# Patient Record
Sex: Male | Born: 1950 | Race: Black or African American | Hispanic: No | Marital: Married | Smoking: Former smoker
Health system: Southern US, Community
[De-identification: ages and names within clinical notes are randomized; demographics above are authoritative.]

## PROBLEM LIST (undated history)

## (undated) DIAGNOSIS — G8929 Other chronic pain: Secondary | ICD-10-CM

## (undated) DIAGNOSIS — J189 Pneumonia, unspecified organism: Secondary | ICD-10-CM

## (undated) DIAGNOSIS — G4730 Sleep apnea, unspecified: Secondary | ICD-10-CM

## (undated) DIAGNOSIS — R531 Weakness: Secondary | ICD-10-CM

## (undated) DIAGNOSIS — J45909 Unspecified asthma, uncomplicated: Secondary | ICD-10-CM

## (undated) DIAGNOSIS — Z860100 Personal history of colon polyps, unspecified: Secondary | ICD-10-CM

## (undated) DIAGNOSIS — F419 Anxiety disorder, unspecified: Secondary | ICD-10-CM

## (undated) DIAGNOSIS — M542 Cervicalgia: Secondary | ICD-10-CM

## (undated) DIAGNOSIS — K219 Gastro-esophageal reflux disease without esophagitis: Secondary | ICD-10-CM

## (undated) DIAGNOSIS — J439 Emphysema, unspecified: Secondary | ICD-10-CM

## (undated) DIAGNOSIS — F515 Nightmare disorder: Secondary | ICD-10-CM

## (undated) DIAGNOSIS — I10 Essential (primary) hypertension: Secondary | ICD-10-CM

## (undated) HISTORY — PX: COLONOSCOPY: SHX174

## (undated) HISTORY — PX: HERNIA REPAIR: SHX51

---

## 2013-09-18 ENCOUNTER — Other Ambulatory Visit: Payer: Self-pay | Admitting: Family Medicine

## 2013-09-18 DIAGNOSIS — Z87891 Personal history of nicotine dependence: Secondary | ICD-10-CM

## 2013-09-18 DIAGNOSIS — M79606 Pain in leg, unspecified: Secondary | ICD-10-CM

## 2013-10-06 ENCOUNTER — Other Ambulatory Visit: Payer: Self-pay

## 2014-04-13 DIAGNOSIS — J189 Pneumonia, unspecified organism: Secondary | ICD-10-CM

## 2014-04-13 HISTORY — DX: Pneumonia, unspecified organism: J18.9

## 2015-04-19 ENCOUNTER — Ambulatory Visit: Payer: BC Managed Care – PPO | Admitting: Podiatry

## 2015-04-22 ENCOUNTER — Ambulatory Visit: Payer: BC Managed Care – PPO | Admitting: Podiatry

## 2015-12-26 ENCOUNTER — Ambulatory Visit: Payer: Self-pay | Admitting: Physician Assistant

## 2016-01-28 NOTE — Pre-Procedure Instructions (Signed)
Johnny Spence  01/28/2016      [facility] [place], [place] - [address] [place] [address] Phone: [phone] Fax: [phone]    Your procedure is scheduled on Thurs, Oct 26 @ 7:30 AM  [facility] to [facility] Admitting at 5:30 AM  Call this number if you have problems the morning of surgery:  [phone]   Remember:  Do not eat food or drink liquids after midnight.  Take these medicines the morning of surgery with A SIP OF WATER Albuterol<Bring Your Inhaler With You>,Symbicort(bring inhaler),and Omeprazole(Prilosec)             Stop taking any Vitamins or Herbal Medications. No Goody's,BC's,Aleve,Advil,Motrin,Ibuprofen,or Fish Oil.    Do not wear jewelry.  Do not wear lotions, powders,colognes, or deoderant.             Men may shave face and neck.  Do not bring valuables to the hospital.  [facility] is not responsible for any belongings or valuables.  Contacts, dentures or bridgework may not be worn into surgery.  Leave your suitcase in the car.  After surgery it may be brought to your room.  For patients admitted to the hospital, discharge time will be determined by your treatment team.  Patients discharged the day of surgery will not be allowed to drive home.    [facility]  Before surgery, you can play an important role.  Because skin is not sterile, your skin needs to be as free of germs as possible.  You can reduce the number of germs on you skin by washing with CHG (chlorahexidine gluconate) soap before surgery.  CHG is an antiseptic cleaner which kills germs and bonds with the skin to continue killing germs even after washing.  Please DO NOT use if you have an allergy to CHG or antibacterial soaps.  If your skin becomes reddened/irritated stop using the CHG and inform your nurse when you arrive at Short Stay.  Do not shave (including legs and underarms) for at  least 48 hours prior to the first CHG shower.  You may shave your face.  Please follow these instructions carefully:   1.  Shower with CHG Soap the night before surgery and the                                morning of Surgery.  2.  If you choose to wash your hair, wash your hair first as usual with your       normal shampoo.  3.  After you shampoo, rinse your hair and body thoroughly to remove the                      Shampoo.  4.  Use CHG as you would any other liquid soap.  You can apply chg directly       to the skin and wash gently with scrungie or a clean washcloth.  5.  Apply the CHG Soap to your body ONLY FROM THE NECK DOWN.        Do not use on open wounds or open sores.  Avoid contact with your eyes,       ears, mouth and genitals (private parts).  Wash genitals (private parts)       with your normal soap.  6.  Wash thoroughly, paying special  attention to the area where your surgery        will be performed.  7.  Thoroughly rinse your body with warm water from the neck down.  8.  DO NOT shower/wash with your normal soap after using and rinsing off       the CHG Soap.  9.  Pat yourself dry with a clean towel.            10.  Wear clean pajamas.            11.  Place clean sheets on your bed the night of your first shower and do not        sleep with pets.  Day of Surgery  Do not apply any lotions/deoderants the morning of surgery.  Please wear clean clothes to the hospital/surgery center.    Please read over the following fact sheets that you were given. Pain Booklet, Coughing and Deep Breathing, MRSA Information and Surgical Site Infection Prevention

## 2016-01-28 NOTE — Progress Notes (Signed)
Verified with PA Porfirio MylarCarmen that knee high teds are what is needed.Orders in epic didn't clarify

## 2016-01-29 ENCOUNTER — Encounter (HOSPITAL_COMMUNITY)
Admission: RE | Admit: 2016-01-29 | Discharge: 2016-01-29 | Disposition: A | Discharge status: Home / Self Care | Payer: Non-veteran care | Source: Ambulatory Visit | Attending: Orthopedic Surgery | Admitting: Orthopedic Surgery

## 2016-01-29 ENCOUNTER — Encounter (HOSPITAL_COMMUNITY): Payer: Self-pay

## 2016-01-29 DIAGNOSIS — M5030 Other cervical disc degeneration, unspecified cervical region: Secondary | ICD-10-CM | POA: Insufficient documentation

## 2016-01-29 DIAGNOSIS — Z01818 Encounter for other preprocedural examination: Secondary | ICD-10-CM | POA: Insufficient documentation

## 2016-01-29 HISTORY — DX: Unspecified asthma, uncomplicated: J45.909

## 2016-01-29 HISTORY — DX: Personal history of colon polyps, unspecified: Z86.0100

## 2016-01-29 HISTORY — DX: Weakness: R53.1

## 2016-01-29 HISTORY — DX: Other chronic pain: G89.29

## 2016-01-29 HISTORY — DX: Emphysema, unspecified: J43.9

## 2016-01-29 HISTORY — DX: Gastro-esophageal reflux disease without esophagitis: K21.9

## 2016-01-29 HISTORY — DX: Cervicalgia: M54.2

## 2016-01-29 HISTORY — DX: Essential (primary) hypertension: I10

## 2016-01-29 HISTORY — DX: Nightmare disorder: F51.5

## 2016-01-29 HISTORY — DX: Pneumonia, unspecified organism: J18.9

## 2016-01-29 HISTORY — DX: Sleep apnea, unspecified: G47.30

## 2016-01-29 HISTORY — DX: Anxiety disorder, unspecified: F41.9

## 2016-01-29 HISTORY — DX: Personal history of colonic polyps: Z86.010

## 2016-01-29 LAB — CBC
HCT: 41 % (ref 39.0–52.0)
Hemoglobin: 14.4 g/dL (ref 13.0–17.0)
MCH: 30.6 pg (ref 26.0–34.0)
MCHC: 35.1 g/dL (ref 30.0–36.0)
MCV: 87 fL (ref 78.0–100.0)
PLATELETS: 227 10*3/uL (ref 150–400)
RBC: 4.71 MIL/uL (ref 4.22–5.81)
RDW: 12.7 % (ref 11.5–15.5)
WBC: 5.1 10*3/uL (ref 4.0–10.5)

## 2016-01-29 LAB — BASIC METABOLIC PANEL
Anion gap: 9 (ref 5–15)
BUN: 9 mg/dL (ref 6–20)
CALCIUM: 9.4 mg/dL (ref 8.9–10.3)
CO2: 25 mmol/L (ref 22–32)
CREATININE: 1.2 mg/dL (ref 0.61–1.24)
Chloride: 105 mmol/L (ref 101–111)
GFR calc Af Amer: >60 mL/min (ref >60)
GLUCOSE: 106 mg/dL — AB (ref 65–99)
Potassium: 4 mmol/L (ref 3.5–5.1)
SODIUM: 139 mmol/L (ref 135–145)

## 2016-01-29 LAB — SURGICAL PCR SCREEN
MRSA, PCR: NEGATIVE
STAPHYLOCOCCUS AUREUS: NEGATIVE

## 2016-01-29 NOTE — Progress Notes (Addendum)
Cardiologist denies  Medical Md is Dr.Stephanie Borum   Echo denies  Stress test > 6 yrs ago  Heart cath denies  EKG to be requested from [facility] in [place]  CXR to be requested from [facility] in [place]

## 2016-02-06 ENCOUNTER — Ambulatory Visit (HOSPITAL_COMMUNITY): Payer: Non-veteran care | Admitting: Anesthesiology

## 2016-02-06 ENCOUNTER — Encounter (HOSPITAL_COMMUNITY)
Admission: RE | Disposition: A | Discharge status: Home / Self Care | Payer: Self-pay | Source: Ambulatory Visit | Attending: Orthopedic Surgery

## 2016-02-06 ENCOUNTER — Observation Stay (HOSPITAL_COMMUNITY): Payer: Non-veteran care

## 2016-02-06 ENCOUNTER — Encounter (HOSPITAL_COMMUNITY): Payer: Self-pay | Admitting: Certified Registered Nurse Anesthetist

## 2016-02-06 ENCOUNTER — Ambulatory Visit (HOSPITAL_COMMUNITY): Payer: Non-veteran care | Admitting: Emergency Medicine

## 2016-02-06 ENCOUNTER — Observation Stay (HOSPITAL_COMMUNITY)
Admission: RE | Admit: 2016-02-06 | Discharge: 2016-02-07 | Disposition: A | Discharge status: Home / Self Care | Payer: Non-veteran care | Source: Ambulatory Visit | Attending: Orthopedic Surgery | Admitting: Orthopedic Surgery

## 2016-02-06 DIAGNOSIS — M4712 Other spondylosis with myelopathy, cervical region: Secondary | ICD-10-CM | POA: Diagnosis present

## 2016-02-06 DIAGNOSIS — Z79899 Other long term (current) drug therapy: Secondary | ICD-10-CM | POA: Diagnosis not present

## 2016-02-06 DIAGNOSIS — K219 Gastro-esophageal reflux disease without esophagitis: Secondary | ICD-10-CM | POA: Diagnosis not present

## 2016-02-06 DIAGNOSIS — I10 Essential (primary) hypertension: Secondary | ICD-10-CM | POA: Insufficient documentation

## 2016-02-06 DIAGNOSIS — Z87891 Personal history of nicotine dependence: Secondary | ICD-10-CM | POA: Insufficient documentation

## 2016-02-06 DIAGNOSIS — J439 Emphysema, unspecified: Secondary | ICD-10-CM | POA: Diagnosis not present

## 2016-02-06 DIAGNOSIS — M4722 Other spondylosis with radiculopathy, cervical region: Secondary | ICD-10-CM | POA: Insufficient documentation

## 2016-02-06 DIAGNOSIS — G959 Disease of spinal cord, unspecified: Secondary | ICD-10-CM | POA: Diagnosis present

## 2016-02-06 DIAGNOSIS — Z981 Arthrodesis status: Secondary | ICD-10-CM

## 2016-02-06 DIAGNOSIS — G4730 Sleep apnea, unspecified: Secondary | ICD-10-CM | POA: Insufficient documentation

## 2016-02-06 DIAGNOSIS — Z419 Encounter for procedure for purposes other than remedying health state, unspecified: Secondary | ICD-10-CM

## 2016-02-06 HISTORY — PX: ANTERIOR CERVICAL DECOMP/DISCECTOMY FUSION: SHX1161

## 2016-02-06 SURGERY — ANTERIOR CERVICAL DECOMPRESSION/DISCECTOMY FUSION 2 LEVEL/HARDWARE REMOVAL
Anesthesia: General | Site: Neck

## 2016-02-06 MED ORDER — PHENOL 1.4 % MT LIQD
1.0000 | OROMUCOSAL | Status: DC | PRN
  Administered 2016-02-06: 1 via OROMUCOSAL
  Filled 2016-02-06: qty 177

## 2016-02-06 MED ORDER — METOPROLOL TARTARATE 1 MG/ML SYRINGE (5ML)
Status: DC | PRN
  Administered 2016-02-06 (×2): 2.5 mg via INTRAVENOUS

## 2016-02-06 MED ORDER — ONDANSETRON HCL 4 MG PO TABS: 4.0000 mg | ORAL_TABLET | Freq: Three times a day (TID) | ORAL | 0 refills | Status: AC | PRN

## 2016-02-06 MED ORDER — MENTHOL 3 MG MT LOZG
1.0000 | LOZENGE | OROMUCOSAL | Status: DC | PRN
  Filled 2016-02-06: qty 9

## 2016-02-06 MED ORDER — ONDANSETRON HCL 4 MG/2ML IJ SOLN: 4.0000 mg | INTRAMUSCULAR | Status: DC | PRN

## 2016-02-06 MED ORDER — ARTIFICIAL TEARS OP OINT
TOPICAL_OINTMENT | OPHTHALMIC | Status: AC
  Filled 2016-02-06: qty 3.5

## 2016-02-06 MED ORDER — PROPOFOL 1000 MG/100ML IV EMUL
INTRAVENOUS | Status: AC
  Filled 2016-02-06: qty 100

## 2016-02-06 MED ORDER — ACETAMINOPHEN 10 MG/ML IV SOLN
INTRAVENOUS | Status: AC
  Filled 2016-02-06: qty 100

## 2016-02-06 MED ORDER — DEXTROSE 5 % IV SOLN
500.0000 mg | Freq: Four times a day (QID) | INTRAVENOUS | Status: DC | PRN
  Filled 2016-02-06: qty 5

## 2016-02-06 MED ORDER — PHENYLEPHRINE HCL 10 MG/ML IJ SOLN
INTRAVENOUS | Status: DC | PRN
  Administered 2016-02-06: 25 ug/min via INTRAVENOUS
  Administered 2016-02-06 (×2): via INTRAVENOUS

## 2016-02-06 MED ORDER — ONDANSETRON HCL 4 MG/2ML IJ SOLN: 4.0000 mg | Freq: Once | INTRAMUSCULAR | Status: DC | PRN

## 2016-02-06 MED ORDER — GLYCOPYRROLATE 0.2 MG/ML IJ SOLN
INTRAMUSCULAR | Status: DC | PRN
  Administered 2016-02-06 (×2): 0.1 mg via INTRAVENOUS

## 2016-02-06 MED ORDER — ALBUTEROL SULFATE HFA 108 (90 BASE) MCG/ACT IN AERS
INHALATION_SPRAY | RESPIRATORY_TRACT | Status: AC
  Filled 2016-02-06: qty 6.7

## 2016-02-06 MED ORDER — DEXAMETHASONE SODIUM PHOSPHATE 10 MG/ML IJ SOLN
INTRAMUSCULAR | Status: DC | PRN
  Administered 2016-02-06: 10 mg via INTRAVENOUS

## 2016-02-06 MED ORDER — KETAMINE HCL-SODIUM CHLORIDE 100-0.9 MG/10ML-% IV SOSY
PREFILLED_SYRINGE | INTRAVENOUS | Status: AC
  Filled 2016-02-06: qty 10

## 2016-02-06 MED ORDER — SODIUM CHLORIDE 0.9 % IV SOLN: 250.0000 mL | INTRAVENOUS | Status: DC

## 2016-02-06 MED ORDER — ARTIFICIAL TEARS OP OINT
TOPICAL_OINTMENT | OPHTHALMIC | Status: DC | PRN
  Administered 2016-02-06: 1 via OPHTHALMIC

## 2016-02-06 MED ORDER — SODIUM CHLORIDE 0.9% FLUSH
3.0000 mL | Freq: Two times a day (BID) | INTRAVENOUS | Status: DC
  Administered 2016-02-07: 3 mL via INTRAVENOUS

## 2016-02-06 MED ORDER — OXYCODONE HCL 5 MG PO TABS
10.0000 mg | ORAL_TABLET | ORAL | Status: DC | PRN
  Administered 2016-02-06 – 2016-02-07 (×4): 10 mg via ORAL
  Filled 2016-02-06 (×4): qty 2

## 2016-02-06 MED ORDER — LIDOCAINE-EPINEPHRINE (PF) 1 %-1:200000 IJ SOLN
INTRAMUSCULAR | Status: DC | PRN
  Administered 2016-02-06: 4 mL

## 2016-02-06 MED ORDER — MIDAZOLAM HCL 2 MG/2ML IJ SOLN
INTRAMUSCULAR | Status: AC
  Filled 2016-02-06: qty 2

## 2016-02-06 MED ORDER — 0.9 % SODIUM CHLORIDE (POUR BTL) OPTIME
TOPICAL | Status: DC | PRN
  Administered 2016-02-06: 1000 mL

## 2016-02-06 MED ORDER — KETAMINE HCL 100 MG/ML IJ SOLN
INTRAMUSCULAR | Status: AC
  Filled 2016-02-06: qty 1

## 2016-02-06 MED ORDER — CEFAZOLIN SODIUM-DEXTROSE 2-4 GM/100ML-% IV SOLN
2.0000 g | INTRAVENOUS | Status: AC
  Administered 2016-02-06 (×2): 2 g via INTRAVENOUS
  Filled 2016-02-06: qty 100

## 2016-02-06 MED ORDER — FENTANYL CITRATE (PF) 100 MCG/2ML IJ SOLN
INTRAMUSCULAR | Status: DC | PRN
  Administered 2016-02-06: 50 ug via INTRAVENOUS
  Administered 2016-02-06: 150 ug via INTRAVENOUS

## 2016-02-06 MED ORDER — HEMOSTATIC AGENTS (NO CHARGE) OPTIME
TOPICAL | Status: DC | PRN
  Administered 2016-02-06: 1
  Administered 2016-02-06: 1 via TOPICAL

## 2016-02-06 MED ORDER — KETAMINE HCL 10 MG/ML IJ SOLN
INTRAMUSCULAR | Status: DC | PRN
  Administered 2016-02-06 (×2): 20 mg via INTRAVENOUS
  Administered 2016-02-06: 10 mg via INTRAVENOUS

## 2016-02-06 MED ORDER — METHOCARBAMOL 500 MG PO TABS: 500.0000 mg | ORAL_TABLET | Freq: Three times a day (TID) | ORAL | 0 refills | Status: AC | PRN

## 2016-02-06 MED ORDER — PROPOFOL 10 MG/ML IV BOLUS
INTRAVENOUS | Status: AC
  Filled 2016-02-06: qty 20

## 2016-02-06 MED ORDER — FENTANYL CITRATE (PF) 100 MCG/2ML IJ SOLN: 25.0000 ug | INTRAMUSCULAR | Status: DC | PRN

## 2016-02-06 MED ORDER — CEFAZOLIN SODIUM-DEXTROSE 2-4 GM/100ML-% IV SOLN
2.0000 g | Freq: Three times a day (TID) | INTRAVENOUS | Status: AC
  Administered 2016-02-06 – 2016-02-07 (×2): 2 g via INTRAVENOUS
  Filled 2016-02-06 (×2): qty 100

## 2016-02-06 MED ORDER — DEXAMETHASONE 4 MG PO TABS
4.0000 mg | ORAL_TABLET | Freq: Four times a day (QID) | ORAL | Status: DC
  Administered 2016-02-06 – 2016-02-07 (×4): 4 mg via ORAL
  Filled 2016-02-06 (×4): qty 1

## 2016-02-06 MED ORDER — ALBUTEROL SULFATE HFA 108 (90 BASE) MCG/ACT IN AERS
INHALATION_SPRAY | RESPIRATORY_TRACT | Status: DC | PRN
  Administered 2016-02-06 (×2): 1 via RESPIRATORY_TRACT

## 2016-02-06 MED ORDER — OXYCODONE-ACETAMINOPHEN 10-325 MG PO TABS: 1.0000 | ORAL_TABLET | ORAL | 0 refills | Status: AC | PRN

## 2016-02-06 MED ORDER — PROPOFOL 10 MG/ML IV BOLUS
INTRAVENOUS | Status: DC | PRN
  Administered 2016-02-06: 150 mg via INTRAVENOUS
  Administered 2016-02-06: 50 mg via INTRAVENOUS

## 2016-02-06 MED ORDER — ROCURONIUM BROMIDE 10 MG/ML (PF) SYRINGE
PREFILLED_SYRINGE | INTRAVENOUS | Status: AC
  Filled 2016-02-06: qty 10

## 2016-02-06 MED ORDER — FENTANYL CITRATE (PF) 100 MCG/2ML IJ SOLN
INTRAMUSCULAR | Status: AC
  Filled 2016-02-06: qty 4

## 2016-02-06 MED ORDER — ONDANSETRON HCL 4 MG/2ML IJ SOLN
INTRAMUSCULAR | Status: AC
  Filled 2016-02-06: qty 2

## 2016-02-06 MED ORDER — DEXAMETHASONE SODIUM PHOSPHATE 4 MG/ML IJ SOLN: 4.0000 mg | Freq: Four times a day (QID) | INTRAMUSCULAR | Status: DC

## 2016-02-06 MED ORDER — DEXAMETHASONE SODIUM PHOSPHATE 10 MG/ML IJ SOLN
INTRAMUSCULAR | Status: AC
  Filled 2016-02-06: qty 1

## 2016-02-06 MED ORDER — MOMETASONE FURO-FORMOTEROL FUM 100-5 MCG/ACT IN AERO
2.0000 | INHALATION_SPRAY | Freq: Two times a day (BID) | RESPIRATORY_TRACT | Status: DC
  Administered 2016-02-06 – 2016-02-07 (×2): 2 via RESPIRATORY_TRACT
  Filled 2016-02-06: qty 8.8

## 2016-02-06 MED ORDER — OXYCODONE HCL 5 MG/5ML PO SOLN: 5.0000 mg | Freq: Once | ORAL | Status: AC | PRN

## 2016-02-06 MED ORDER — PHENYLEPHRINE HCL 10 MG/ML IJ SOLN
INTRAMUSCULAR | Status: DC | PRN
  Administered 2016-02-06: 80 ug via INTRAVENOUS
  Administered 2016-02-06: 120 ug via INTRAVENOUS
  Administered 2016-02-06: 40 ug via INTRAVENOUS
  Administered 2016-02-06: 160 ug via INTRAVENOUS

## 2016-02-06 MED ORDER — THROMBIN 20000 UNITS EX SOLR
CUTANEOUS | Status: AC
  Filled 2016-02-06: qty 20000

## 2016-02-06 MED ORDER — AMLODIPINE BESYLATE 10 MG PO TABS
10.0000 mg | ORAL_TABLET | Freq: Every evening | ORAL | Status: DC
  Administered 2016-02-06: 10 mg via ORAL
  Filled 2016-02-06 (×2): qty 1

## 2016-02-06 MED ORDER — METHOCARBAMOL 500 MG PO TABS
ORAL_TABLET | ORAL | Status: AC
  Administered 2016-02-06: 500 mg via ORAL
  Filled 2016-02-06: qty 1

## 2016-02-06 MED ORDER — LACTATED RINGERS IV SOLN: INTRAVENOUS | Status: DC

## 2016-02-06 MED ORDER — PROPOFOL 1000 MG/100ML IV EMUL
INTRAVENOUS | Status: AC
  Filled 2016-02-06: qty 200

## 2016-02-06 MED ORDER — LIDOCAINE-EPINEPHRINE (PF) 1 %-1:200000 IJ SOLN
INTRAMUSCULAR | Status: AC
  Filled 2016-02-06: qty 30

## 2016-02-06 MED ORDER — METOPROLOL TARTRATE 5 MG/5ML IV SOLN
INTRAVENOUS | Status: AC
  Filled 2016-02-06: qty 5

## 2016-02-06 MED ORDER — LIDOCAINE 2% (20 MG/ML) 5 ML SYRINGE
INTRAMUSCULAR | Status: AC
  Filled 2016-02-06: qty 5

## 2016-02-06 MED ORDER — ALBUTEROL SULFATE (2.5 MG/3ML) 0.083% IN NEBU: 2.5000 mg | INHALATION_SOLUTION | Freq: Four times a day (QID) | RESPIRATORY_TRACT | Status: DC | PRN

## 2016-02-06 MED ORDER — SUCCINYLCHOLINE CHLORIDE 20 MG/ML IJ SOLN
INTRAMUSCULAR | Status: DC | PRN
  Administered 2016-02-06: 140 mg via INTRAVENOUS

## 2016-02-06 MED ORDER — MORPHINE SULFATE (PF) 2 MG/ML IV SOLN
INTRAVENOUS | Status: AC
  Administered 2016-02-06: 1 mg via INTRAVENOUS
  Filled 2016-02-06: qty 1

## 2016-02-06 MED ORDER — OXYCODONE HCL 5 MG PO TABS
5.0000 mg | ORAL_TABLET | Freq: Once | ORAL | Status: AC | PRN
  Administered 2016-02-06: 5 mg via ORAL

## 2016-02-06 MED ORDER — MORPHINE SULFATE (PF) 2 MG/ML IV SOLN
1.0000 mg | INTRAVENOUS | Status: DC | PRN
  Administered 2016-02-06 (×2): 1 mg via INTRAVENOUS

## 2016-02-06 MED ORDER — PROPOFOL 500 MG/50ML IV EMUL
INTRAVENOUS | Status: DC | PRN
  Administered 2016-02-06: 09:00:00 via INTRAVENOUS
  Administered 2016-02-06: 50 ug/kg/min via INTRAVENOUS
  Administered 2016-02-06: 10:00:00 via INTRAVENOUS

## 2016-02-06 MED ORDER — SODIUM CHLORIDE 0.9% FLUSH: 3.0000 mL | INTRAVENOUS | Status: DC | PRN

## 2016-02-06 MED ORDER — ONDANSETRON HCL 4 MG/2ML IJ SOLN
INTRAMUSCULAR | Status: DC | PRN
  Administered 2016-02-06 (×2): 4 mg via INTRAVENOUS

## 2016-02-06 MED ORDER — ACETAMINOPHEN 10 MG/ML IV SOLN
INTRAVENOUS | Status: DC | PRN
  Administered 2016-02-06: 1000 mg via INTRAVENOUS

## 2016-02-06 MED ORDER — SUCCINYLCHOLINE CHLORIDE 200 MG/10ML IV SOSY
PREFILLED_SYRINGE | INTRAVENOUS | Status: AC
  Filled 2016-02-06: qty 10

## 2016-02-06 MED ORDER — GLYCOPYRROLATE 0.2 MG/ML IV SOSY
PREFILLED_SYRINGE | INTRAVENOUS | Status: AC
  Filled 2016-02-06: qty 3

## 2016-02-06 MED ORDER — LIDOCAINE HCL (CARDIAC) 20 MG/ML IV SOLN
INTRAVENOUS | Status: DC | PRN
  Administered 2016-02-06: 60 mg via INTRATRACHEAL

## 2016-02-06 MED ORDER — THROMBIN 20000 UNITS EX SOLR
CUTANEOUS | Status: DC | PRN
  Administered 2016-02-06: 20000 [IU] via TOPICAL

## 2016-02-06 MED ORDER — PANTOPRAZOLE SODIUM 40 MG PO TBEC
40.0000 mg | DELAYED_RELEASE_TABLET | Freq: Every day | ORAL | Status: DC
  Administered 2016-02-06 – 2016-02-07 (×2): 40 mg via ORAL
  Filled 2016-02-06 (×2): qty 1

## 2016-02-06 MED ORDER — METHOCARBAMOL 500 MG PO TABS
500.0000 mg | ORAL_TABLET | Freq: Four times a day (QID) | ORAL | Status: DC | PRN
  Administered 2016-02-06 – 2016-02-07 (×4): 500 mg via ORAL
  Filled 2016-02-06 (×3): qty 1

## 2016-02-06 MED ORDER — LACTATED RINGERS IV SOLN
INTRAVENOUS | Status: DC | PRN
  Administered 2016-02-06 (×3): via INTRAVENOUS

## 2016-02-06 MED ORDER — OXYCODONE HCL 5 MG PO TABS
ORAL_TABLET | ORAL | Status: AC
  Administered 2016-02-06: 5 mg via ORAL
  Filled 2016-02-06: qty 1

## 2016-02-06 SURGICAL SUPPLY — 76 items
BIT DRILL SKYLINE 12MM (BIT) ×1 IMPLANT
BLADE SURG 15 STRL LF DISP TIS (BLADE) IMPLANT
BLADE SURG 15 STRL SS (BLADE)
BLADE SURG ROTATE 9660 (MISCELLANEOUS) IMPLANT
BUR EGG ELITE 4.0 (BURR) IMPLANT
BUR EGG ELITE 4.0MM (BURR)
BUR MATCHSTICK NEURO 3.0 LAGG (BURR) IMPLANT
CAGE LORDOTIC 6 SM (Cage) ×2 IMPLANT
CAGE LORDOTIC 6MM SM (Cage) ×1 IMPLANT
CANISTER SUCTION 2500CC (MISCELLANEOUS) ×3 IMPLANT
CLOSURE STERI-STRIP 1/2X4 (GAUZE/BANDAGES/DRESSINGS) ×1
CLSR STERI-STRIP ANTIMIC 1/2X4 (GAUZE/BANDAGES/DRESSINGS) ×2 IMPLANT
CORDS BIPOLAR (ELECTRODE) ×3 IMPLANT
COVER SURGICAL LIGHT HANDLE (MISCELLANEOUS) ×6 IMPLANT
CRADLE DONUT ADULT HEAD (MISCELLANEOUS) ×3 IMPLANT
DEVICE ENDSKLTN IMPLANT SM 7MM (Cage) ×1 IMPLANT
DRAPE C-ARM 42X72 X-RAY (DRAPES) ×3 IMPLANT
DRAPE POUCH INSTRU U-SHP 10X18 (DRAPES) ×3 IMPLANT
DRAPE SURG 17X23 STRL (DRAPES) ×3 IMPLANT
DRAPE U-SHAPE 47X51 STRL (DRAPES) ×3 IMPLANT
DRILL BIT SKYLINE 12MM (BIT) ×2
DRSG MEPILEX BORDER 4X4 (GAUZE/BANDAGES/DRESSINGS) ×3 IMPLANT
DURAPREP 26ML APPLICATOR (WOUND CARE) ×3 IMPLANT
ELECT COATED BLADE 2.86 ST (ELECTRODE) ×3 IMPLANT
ELECT PENCIL ROCKER SW 15FT (MISCELLANEOUS) ×3 IMPLANT
ELECT REM PT RETURN 9FT ADLT (ELECTROSURGICAL) ×3
ELECTRODE REM PT RTRN 9FT ADLT (ELECTROSURGICAL) ×1 IMPLANT
ENDOSKELETON IMPLANT SM 7MM (Cage) ×3 IMPLANT
FEE INTRAOP MONITOR IMPULS NCS (MISCELLANEOUS) ×1 IMPLANT
GLOVE BIO SURGEON STRL SZ 6.5 (GLOVE) ×2 IMPLANT
GLOVE BIO SURGEONS STRL SZ 6.5 (GLOVE) ×1
GLOVE BIOGEL PI IND STRL 6.5 (GLOVE) ×1 IMPLANT
GLOVE BIOGEL PI IND STRL 8.5 (GLOVE) ×1 IMPLANT
GLOVE BIOGEL PI INDICATOR 6.5 (GLOVE) ×2
GLOVE BIOGEL PI INDICATOR 8.5 (GLOVE) ×2
GLOVE SS BIOGEL STRL SZ 8.5 (GLOVE) ×1 IMPLANT
GLOVE SUPERSENSE BIOGEL SZ 8.5 (GLOVE) ×2
GOWN STRL REUS W/ TWL XL LVL3 (GOWN DISPOSABLE) ×2 IMPLANT
GOWN STRL REUS W/TWL 2XL LVL3 (GOWN DISPOSABLE) ×6 IMPLANT
GOWN STRL REUS W/TWL XL LVL3 (GOWN DISPOSABLE) ×4
INTRAOP MONITOR FEE IMPULS NCS (MISCELLANEOUS) ×1
INTRAOP MONITOR FEE IMPULSE (MISCELLANEOUS) ×2
KIT BASIN OR (CUSTOM PROCEDURE TRAY) ×3 IMPLANT
KIT ROOM TURNOVER OR (KITS) ×3 IMPLANT
MATRIX HEMOSTAT SURGIFLO (HEMOSTASIS) ×3 IMPLANT
NEEDLE SPNL 18GX3.5 QUINCKE PK (NEEDLE) ×3 IMPLANT
NS IRRIG 1000ML POUR BTL (IV SOLUTION) ×3 IMPLANT
PACK ORTHO CERVICAL (CUSTOM PROCEDURE TRAY) ×3 IMPLANT
PACK UNIVERSAL I (CUSTOM PROCEDURE TRAY) ×3 IMPLANT
PAD ARMBOARD 7.5X6 YLW CONV (MISCELLANEOUS) ×6 IMPLANT
PATTIES SURGICAL .25X.25 (GAUZE/BANDAGES/DRESSINGS) ×3 IMPLANT
PIN DISTRACTION 14 (PIN) ×9 IMPLANT
PLATE SKYLINE TWO LEVEL 28MM (Plate) ×3 IMPLANT
PUTTY BONE DBX 2.5 MIS (Bone Implant) ×3 IMPLANT
RESTRAINT LIMB HOLDER UNIV (RESTRAINTS) ×3 IMPLANT
SCREW SKYLINE 14MM SD-VA (Screw) ×18 IMPLANT
SCREW SKYLINE 16MM (Screw) ×6 IMPLANT
SPONGE INTESTINAL PEANUT (DISPOSABLE) ×6 IMPLANT
SPONGE LAP 4X18 X RAY DECT (DISPOSABLE) IMPLANT
SPONGE SURGIFOAM ABS GEL 100 (HEMOSTASIS) ×3 IMPLANT
SURGIFLO W/THROMBIN 8M KIT (HEMOSTASIS) IMPLANT
SUT BONE WAX W31G (SUTURE) ×3 IMPLANT
SUT MON AB 3-0 SH 27 (SUTURE) ×2
SUT MON AB 3-0 SH27 (SUTURE) ×1 IMPLANT
SUT SILK 2 0 (SUTURE)
SUT SILK 2-0 18XBRD TIE 12 (SUTURE) IMPLANT
SUT VIC AB 2-0 CT1 18 (SUTURE) ×3 IMPLANT
SYR BULB IRRIGATION 50ML (SYRINGE) ×3 IMPLANT
SYR CONTROL 10ML LL (SYRINGE) ×3 IMPLANT
TAPE CLOTH 4X10 WHT NS (GAUZE/BANDAGES/DRESSINGS) ×3 IMPLANT
TAPE UMBILICAL COTTON 1/8X30 (MISCELLANEOUS) ×3 IMPLANT
TOWEL OR 17X24 6PK STRL BLUE (TOWEL DISPOSABLE) ×3 IMPLANT
TOWEL OR 17X26 10 PK STRL BLUE (TOWEL DISPOSABLE) ×3 IMPLANT
TRAY FOLEY CATH 16FRSI W/METER (SET/KITS/TRAYS/PACK) IMPLANT
WATER STERILE IRR 1000ML POUR (IV SOLUTION) IMPLANT
YANKAUER SUCT BULB TIP NO VENT (SUCTIONS) ×3 IMPLANT

## 2016-02-06 NOTE — Anesthesia Postprocedure Evaluation (Signed)
Anesthesia Post Note  Patient: Johnny Spence  Procedure(s) Performed: Procedure(s) (LRB): ANTERIOR CERVICAL DECOMPRESSION/DISCECTOMY FUSION C4 - C6  2 LEVEL/HARDWARE REMOVAL (N/A)  Patient location during evaluation: PACU Anesthesia Type: General Level of consciousness: awake, awake and alert and oriented Pain management: pain level controlled Vital Signs Assessment: post-procedure vital signs reviewed and stable Respiratory status: spontaneous breathing, nonlabored ventilation and respiratory function stable Cardiovascular status: blood pressure returned to baseline Anesthetic complications: no    Last Vitals:  Vitals:   02/06/16 1715 02/06/16 2000  BP: (!) 139/93 134/82  Pulse: 84 93  Resp: 16 16  Temp: 36.8 C 36.8 C    Last Pain:  Vitals:   02/06/16 2000  TempSrc: Oral  PainSc:                  Koron Godeaux COKER

## 2016-02-06 NOTE — Brief Op Note (Signed)
02/06/2016  12:14 PM  PATIENT:  Johnny Spence  65 y.o. male  PRE-OPERATIVE DIAGNOSIS:  DDD C4 - C6  POST-OPERATIVE DIAGNOSIS:  DDD C4 - C6  PROCEDURE:  Procedure(s): ANTERIOR CERVICAL DECOMPRESSION/DISCECTOMY FUSION C4 - C6  2 LEVEL/HARDWARE REMOVAL (N/A)  SURGEON:  Surgeon(s) and Role:    * Venita Lickahari Latonda Larrivee, MD - Primary  PHYSICIAN ASSISTANT:   ASSISTANTS: none   ANESTHESIA:   general  EBL:  Total I/O In: 2000 [I.V.:2000] Out: 950 [Urine:900; Blood:50]  BLOOD ADMINISTERED:none  DRAINS: none   LOCAL MEDICATIONS USED:  LIDOCAINE   SPECIMEN:  No Specimen  DISPOSITION OF SPECIMEN:  N/A  COUNTS:  YES  TOURNIQUET:  * No tourniquets in log *  DICTATION: .Other Dictation: Dictation Number [id number]  PLAN OF CARE: Admit for overnight observation  PATIENT DISPOSITION:  PACU - hemodynamically stable.

## 2016-02-06 NOTE — Progress Notes (Signed)
Orthopedic Tech Progress Note Patient Details:  Johnny Spence 1950/08/07 [id number] Informed [facility] about Aspen cervical collar order, [facility] said patient already had brace. Patient ID: Johnny Spence, male   DOB: 1950/08/07, 65 y.o.   MRN: [id number]   Johnny Spence 02/06/2016, 3:18 PM

## 2016-02-06 NOTE — H&P (Signed)
History of Present Illness  The patient is a 65 year old male who comes in today for a preoperative History and Physical. The patient is scheduled for a ACDF C5-C6 to be performed by Dr. Debria Garret D. Shon Baton, MD at [facility] on 02/06/2016 . Please see the hospital record for complete dictated history and physical. Pt reports a hx of Asthma and Sleep apnea. This has been discuissed at his preop visit. He understands to bring his mask to the hospital.   Problem List/Past Medical  Problems Reconciled   Allergies  No Known Drug Allergies [12/17/2015]: Allergies Reconciled   Family History  Cancer  brother  Social History  Alcohol use  current drinker; drinks wine; only occasionally per week Children  1 Current work status  retired Financial planner (Currently)  no Drug/Alcohol Rehab (Previously)  no Illicit drug use  no Living situation  live with spouse Marital status  married Number of flights of stairs before winded  greater than 5 Pain Contract  no Tobacco / smoke exposure  no Tobacco use  former smoker; smoke(d) less than 1/2 pack(s) per day  Medication History  AmLODIPine Besylate (10MG  Tablet, Oral) Active. (qd) Cialis (5MG  Tablet, Oral) Active. (qd for prostate) Omeprazole (20MG  Capsule DR, Oral) Active. (prn) Centrum Men (Oral) Active. (qd) Fish Oil (1360MG  Capsule, Oral) Active. (qd) Medications Reconciled  Past Surgical History  No pertinent past surgical history   Other Problems Asthma  Emphysema Of Lung  High blood pressure  Sleep Apnea  Cervical radiculopathy (M54.12)   Vitals  01/31/2016 8:09 AM Weight: 195 lb Height: 67in Body Surface Area: 2 m Body Mass Index: 30.54 kg/m  Temp.: 98.82F(Oral)  Pulse: 68 (Regular)  BP: 138/83 (Sitting, Left Arm, Standard)  General General Appearance-Not in acute distress. Orientation-Oriented X3. Build & Nutrition-Well nourished and Well  developed.  Integumentary General Characteristics Surgical Scars - no surgical scar evidence of previous cervical surgery. Cervical Spine-Skin examination of the cervical spine is without deformity, skin lesions, lacerations or abrasions.  Chest and Lung Exam Auscultation Breath sounds - Normal and Clear.  Cardiovascular Auscultation Rhythm - Regular rate and rhythm.  Peripheral Vascular Upper Extremity Palpation - Radial pulse - Bilateral - 2+.  Neurologic Sensation Upper Extremity - Bilateral - sensation is intact in the upper extremity. Reflexes Biceps Reflex - Bilateral - 2+. Brachioradialis Reflex - Bilateral - 2+. Triceps Reflex - Bilateral - 2+. Hoffman's Sign - Bilateral - Hoffman's sign present. Note: hypereflexia of LE no Babinski   Musculoskeletal Spine/Ribs/Pelvis  Cervical Spine : Inspection and Palpation - Tenderness - right cervical paraspinals tender to palpation, left cervical paraspinals tender to palpation and right trapezius tender to palpation. Strength and Tone: Strength: Strength: Strength - Deltoid - Bilateral - 5/5. Biceps - Bilateral - 5/5. Triceps - Bilateral - 5/5. Right - 4-/5. Wrist Extension - Left - 5/5. Hand Grip - Bilateral - 5/5. Heel walk - Bilateral - able to heel walk without difficulty. Toe Walk - Bilateral - able to walk on toes without difficulty. Heel-Toe Walk - Bilateral - able to heel-toe walk without difficulty. ROM - Flexion - Moderately Decreased and painful. Extension - Moderately Decreased and painful. Left Lateral Flexion - Moderately Decreased and painful. Right Lateral Flexion - Moderately Decreased and painful. Left Rotation - Moderately Decreased and painful. Right Rotation - Moderately Decreased and painful. Pain - . Cervical Spine - Special Testing - axial compression test negative, cross chest impingement test negative. Non-Anatomic Signs - No non-anatomic signs present. Upper  Extremity Range of Motion - No truesholder pain  with IR/ER of the shoulders.    Assessment & Plan  Goal Of Surgery: Discussed that goal of surgery is to reduce pain and improve function and quality of life. Patient is aware that despite all appropriate treatment that there pain and function could be the same, worse, or different.  Anterior cervical fusion:Risks of surgery include, but are not limited to: Throat pain, swallowing difficulty, hoarseness or change in voice, death, stroke, paralysis, nerve root damage/injury, bleeding, blood clots, loss of bowel/bladder control, hardware failure, or mal-position, spinal fluid leak, adjacent segment disease, non-union, need for further surgery, ongoing or worse pain, infection. Post-operative bleeding or swelling that could require emergent surgery. Follow up in 2 weeks  A very pleasant 65 year old male patient with a past medical history significant for two years of neck pain. He also reports radicular pain into his right upper extremity. He also reports dysfunction of his first three metacarpals. The patient reports severe numbness and radicular pain into that arm. He reports pain with range of motion. The patient did present to the [facility], which they did get an MRI. It was significant for spinal cord signal change at C4-5 and C5-6, which also suggests edema and myelopathy. At C3-4, there is moderate disappear disc height loss, moderate broad based disc osteophyte complex, mild impression on the ventral cord surface, severe, hypertrophy severe right and moderate left neural foraminal narrowing. Severe disc height loss, severe broad disc osteophyte complex. Severe hypertrophy, severe central canal, bilateral neural foraminal narrowing at C4-5.  Plans Transcription Dr. Shon BatonBrooks did talk with the pt. He discussed with the patient, the worst levels that are contributing to myelopathy and stenosis are at C4-5 and C5-6. Dr. Shon BatonBrooks did discuss with the patient progression of myelopathy if he did not have this  surgical intervention. After consideration the patient did agree. We will today order an ACDF at C4-5 and C5-6. The patient's all questions were elicited and answered. The patient will get surgical clearance from his primary care provider and surgery will be scheduled. Also discussed adjacent segment disease and ongoing neck pain and elected to address only the areas of cord compression.

## 2016-02-06 NOTE — Discharge Instructions (Signed)

## 2016-02-06 NOTE — Anesthesia Procedure Notes (Signed)
Procedure Name: Intubation Date/Time: 02/06/2016 7:45 AM Performed by: Noreene LarssonJOSLIN, DAVID Pre-anesthesia Checklist: Patient identified, Emergency Drugs available, Suction available and Patient being monitored Patient Re-evaluated:Patient Re-evaluated prior to inductionOxygen Delivery Method: Circle system utilized Preoxygenation: Pre-oxygenation with 100% oxygen Intubation Type: IV induction Ventilation: Mask ventilation without difficulty Laryngoscope Size: Glidescope (T4) Grade View: Grade I Tube type: Oral Number of attempts: 1 Airway Equipment and Method: Video-laryngoscopy Placement Confirmation: ETT inserted through vocal cords under direct vision,  positive ETCO2 and breath sounds checked- equal and bilateral Secured at: 21 cm Dental Injury: Teeth and Oropharynx as per pre-operative assessment  Difficulty Due To: Difficulty was anticipated

## 2016-02-06 NOTE — Anesthesia Preprocedure Evaluation (Addendum)
Anesthesia Evaluation  Patient identified by MRN, date of birth, ID band Patient awake    Reviewed: Allergy & Precautions, NPO status , Patient's Chart, lab work & pertinent test results  Airway Mallampati: II  TM Distance: >3 FB Neck ROM: Full    Dental  (+) Teeth Intact, Dental Advisory Given   Pulmonary former smoker,    breath sounds clear to auscultation       Cardiovascular hypertension,  Rhythm:Regular Rate:Normal     Neuro/Psych    GI/Hepatic   Endo/Other    Renal/GU      Musculoskeletal   Abdominal   Peds  Hematology   Anesthesia Other Findings   Reproductive/Obstetrics                             Anesthesia Physical Anesthesia Plan  ASA: II  Anesthesia Plan: General   Post-op Pain Management:    Induction: Intravenous  Airway Management Planned: Oral ETT  Additional Equipment:   Intra-op Plan:   Post-operative Plan: Extubation in OR  Informed Consent: I have reviewed the patients History and Physical, chart, labs and discussed the procedure including the risks, benefits and alternatives for the proposed anesthesia with the patient or authorized representative who has indicated his/her understanding and acceptance.   Dental advisory given  Plan Discussed with: CRNA and Anesthesiologist  Anesthesia Plan Comments:         Anesthesia Quick Evaluation  

## 2016-02-06 NOTE — Transfer of Care (Signed)
Immediate Anesthesia Transfer of Care Note  Patient: Johnny Spence  Procedure(s) Performed: Procedure(s): ANTERIOR CERVICAL DECOMPRESSION/DISCECTOMY FUSION C4 - C6  2 LEVEL/HARDWARE REMOVAL (N/A)  Patient Location: PACU  Anesthesia Type:General  Level of Consciousness: sedated  Airway & Oxygen Therapy: Patient Spontanous Breathing and Patient connected to face mask oxygen  Post-op Assessment: Report given to RN and Post -op Vital signs reviewed and stable  Post vital signs: Reviewed and stable  Last Vitals:  Vitals:   02/06/16 0622 02/06/16 1220  BP: 134/86   Pulse: 90   Resp: 18   Temp: 36.7 C (P) 37.2 C    Last Pain:  Vitals:   02/06/16 0622  TempSrc: Oral         Complications: No apparent anesthesia complications

## 2016-02-06 NOTE — Progress Notes (Signed)
Placed patient on CPAP at 6cm for the night

## 2016-02-07 ENCOUNTER — Encounter (HOSPITAL_COMMUNITY): Payer: Self-pay | Admitting: Orthopedic Surgery

## 2016-02-07 DIAGNOSIS — M4712 Other spondylosis with myelopathy, cervical region: Secondary | ICD-10-CM | POA: Diagnosis not present

## 2016-02-07 NOTE — Op Note (Signed)
Johnny Spence, FAULCON                ACCOUNT NO.:  [id number]  MEDICAL RECORD NO.:  [id number]  LOCATION:  3C06C                        FACILITY:  [facility]  PHYSICIAN:  Johnny Spence D. Shon Baton, M.D. DATE OF BIRTH:  26-Mar-1951  DATE OF PROCEDURE:  02/06/2016 DATE OF DISCHARGE:                              OPERATIVE REPORT   PREOPERATIVE DIAGNOSIS:  Cervical spondylitic myelopathy, C4-5, C5-6.  POSTOPERATIVE DIAGNOSIS:  Cervical spondylitic myelopathy, C4-5, C5-6.  OPERATIVE PROCEDURE:  Anterior cervical diskectomy and fusion, C4-5, C5- 6.  COMPLICATIONS:  None.  CONDITION:  Stable.  HISTORY:  This is a very pleasant gentleman for over 2 years.  He has been having progressive neck and radicular right arm pain as well as difficulty in change in balance and gait and dexterity.  He presented to me, and imaging studies confirmed a myelomalacia.  As a result of his clinical findings and the MRI findings, we elected to proceed with the aforementioned case.  All appropriate risks, benefits, and alternatives were discussed with the patient and consent was obtained.  OPERATIVE NOTE:  The patient was brought to the operating room and placed supine on the operating table.  After successful induction of general anesthesia and endotracheal intubation, TEDs and SCDs were applied.  The arms were secured at the side, and the neuromonitoring representative secured all needles for intraoperative SSEP and evoked motor potential monitoring.  Time-out was taken confirming patient, procedure, and all other pertinent important data.  The neck was then prepped and draped in a standard fashion.  The C5-6 disk space was identified and marked out, and the skin incision site was infiltrated with 1% lidocaine with epi.  A transverse incision was made starting at the midline and proceeding to the left, and sharp dissection was carried out down to the platysma.  The platysma was sharply incised and I continued to  sharply dissect along the medial border of the sternocleidomastoid through the deep cervical fascia.  The omohyoid muscle was identified, isolated, and sacrificed for visualization.  I continued dissecting sharply through the deep cervical and prevertebral fascia.  The carotid sheath was identified and protected with a finger, and I swept the esophagus and trachea off to the right and protected that with a retractor.  I used Kittner dissectors to remove the remaining prevertebral fascia and completely expose the 4-5 and 5-6 disk space.  A needle was placed into the 4-5 level, and x-ray was taken confirming that I was at the appropriate level.  Once this was done, I marked the disk with a Bovie and then mobilized the longus colli muscle from the midbody of C4 down to the midbody of C6.  Self-retaining retractors were placed underneath the longus colli muscle.  The endotracheal cuff was deflated.  I expanded the retractor and then reinflated the endotracheal cuff.  An annulotomy was performed with a 15-blade scalpel, and then I used pituitary rongeurs and curettes to remove the disk material.  I trimmed down the overhanging osteophyte from the inferior aspect of the C5 vertebral body and then placed distraction pins into the 5 and 6 vertebral bodies.  I distracted the intervertebral space and maintained the distraction with the pins.  I continued working posteriorly using my curettes to remove all of the disk material.  Once I was at the posterior margin of vertebral body, I used my nerve hook to sweep underneath the annulus and created a plane.  I used my 1 mm Kerrison to resect the posterior annulus and the posterior bone spurs of the C5 and C6 vertebral bodies.  I then used my nerve hook to create a plane underneath the posterior longitudinal ligament and resected this with a 1 mm Kerrison.  I had an excellent decompression of the thecal sac and then I also decompressed underneath the  uncovertebral joints, especially on the right side as this was his most symptomatic side.  At this point, I rasped the endplates and then trialed and placed the nanoLOCK Titan size 7 small lordotic cage packed with DBX mix into the wound.  This provided excellent distraction and maintained the disk space height and was well fitting.  I then relocated my retractors to expose the 4-5 disk space, and using the same technique I had used to 5-6, I repeated the diskectomy at 4-5. Again, I took down the PLL and decompressed underneath the uncovertebral joints to make sure I had bleeding subchondral bone.  At this time, I placed a size 6 small lordotic nanoLOCK cage.  This provided excellent fixation and maintained distraction of disk space.  At this point, I irrigated the wound copiously with normal saline.  The SSEP and evoked motor potentials remained normal throughout the case.  I then obtained a DePuy Skyline anterior cervical plate and then affixed it with 16 mm self-drilling screws into the body of C4 and 14 mm screws into the bodies of C5 and C6.  All screws had excellent purchase.  I then locked them to the plate according to manufacturer's standard.  I then irrigated copiously with normal saline and made sure I had hemostasis using bipolar electrocautery.  I returned the trachea and esophagus to midline and took final x-rays, which were satisfactory, both the AP and lateral planes.  I then closed the platysma with interrupted 2-0 Vicryl sutures and then the skin with interrupted 3-0 Monocryl.  Steri-Strips and dry dressing were applied.  The patient was ultimately extubated, transferred to PACU without incident.  At the end of the case, all needle and sponge counts were correct.  There were no adverse intraoperative events.     Johnny Spence D. Shon BatonBrooks, M.D.     DDB/MEDQ  D:  02/06/2016  T:  02/07/2016  Job:  [id number]  cc:   Johnny Spence D. Shon BatonBrooks, M.D.

## 2016-02-07 NOTE — Progress Notes (Signed)
Pt discharge education and instructions completed with with outgoing RN Morrie Sheldonshley. Pt discharge home with family to transport him home. Pt IV removed; neck incision remains clean, dry and intact with no stain or active bleeding noted. Aspen brace remains on to neck. Pt transported off unit via wheelchair with belongings and family to the side. Dionne BucyP. Amo Pape Parson RN

## 2016-02-07 NOTE — Evaluation (Signed)
Occupational Therapy Evaluation Patient Details Name: Fredric Slabach MRN: [id number] DOB: 02-11-1951 Today's Date: 02/07/2016    History of Present Illness 65 yo male s/p ACDF C4-6   Past Medical History:  Diagnosis Date  . Anxiety   . Asthma    Symbicort daily and Albuterol as needed  . Chronic neck pain    DDD  . Emphysema lung (HCC)    "mild"  . GERD (gastroesophageal reflux disease)    takes Omeprazole daily  . History of colon polyps    benign  . Hypertension    takes Amlodipine daily  . Nightmares   . Pneumonia 2016  . Sleep apnea   . Weakness    numbness and tingling on right hand      Clinical Impression   Patient evaluated by Occupational Therapy with no further acute OT needs identified. All education has been completed and the patient has no further questions. See below for any follow-up Occupational Therapy or equipment needs. OT to sign off. Thank you for referral.      Follow Up Recommendations  No OT follow up    Equipment Recommendations  None recommended by OT    Recommendations for Other Services       Precautions / Restrictions Precautions Precautions: Cervical Precaution Comments: handout provided and reviewed in detail      Mobility Bed Mobility Overal bed mobility: Modified Independent                Transfers Overall transfer level: Modified independent                    Balance                                            ADL Overall ADL's : Needs assistance/impaired Eating/Feeding: Independent   Grooming: Wash/dry hands;Wash/dry face;Oral care;Supervision/safety   Upper Body Bathing: Set up   Lower Body Bathing: Supervison/ safety Lower Body Bathing Details (indicate cue type and reason): able to cross bil LE Upper Body Dressing : Modified independent       Toilet Transfer: Supervision/safety       Tub/ Shower Transfer: Therapist, sports Details  (indicate cue type and reason): recommend use of shower seat Functional mobility during ADLs: Minimal assistance General ADL Comments: Pt with LOB x2 with head turns and turning exiting room and bathroom     Vision     Perception     Praxis      Pertinent Vitals/Pain Pain Assessment: No/denies pain     Hand Dominance Right   Extremity/Trunk Assessment Upper Extremity Assessment Upper Extremity Assessment: Overall WFL for tasks assessed   Lower Extremity Assessment Lower Extremity Assessment: Defer to PT evaluation   Cervical / Trunk Assessment Cervical / Trunk Assessment: Other exceptions (s/p surg)   Communication Communication Communication: No difficulties   Cognition Arousal/Alertness: Awake/alert Behavior During Therapy: WFL for tasks assessed/performed Overall Cognitive Status: Within Functional Limits for tasks assessed                     General Comments       Exercises       Shoulder Instructions      Home Living Family/patient expects to be discharged to:: Private residence Living Arrangements: Spouse/significant other Available Help at Discharge: Family;Available 24 hours/day Type of Home: Apartment Home Access:  Ramped entrance     Home Layout: One level     Bathroom Shower/Tub: Chief Strategy OfficerTub/shower unit   Bathroom Toilet: Standard     Home Equipment: None   Additional Comments: wife can (A) but uses a rollator for ambulation. She can (A) with meals and dressing but can not provide any balance (A) with ambulation      Prior Functioning/Environment Level of Independence: Independent                 OT Problem List: Decreased strength;Decreased activity tolerance;Impaired balance (sitting and/or standing);Decreased knowledge of use of DME or AE;Decreased knowledge of precautions;Pain;Decreased safety awareness   OT Treatment/Interventions:      OT Goals(Current goals can be found in the care plan section)    OT Frequency:      Barriers to D/C:            Co-evaluation              End of Session Equipment Utilized During Treatment: Gait belt;Cervical collar Nurse Communication: Mobility status;Precautions  Activity Tolerance: Patient tolerated treatment well Patient left: in bed;with call bell/phone within reach;with family/visitor present   Time: 0734-0800 OT Time Calculation (min): 26 min Charges:  OT General Charges $OT Visit: 1 Procedure OT Evaluation $OT Eval Moderate Complexity: 1 Procedure G-Codes: OT G-codes **NOT FOR INPATIENT CLASS** Functional Assessment Tool Used: clinical judgement Functional Limitation: Self care Self Care Current Status (N8295(G8987): At least 1 percent but less than 20 percent impaired, limited or restricted Self Care Goal Status (A2130(G8988): At least 1 percent but less than 20 percent impaired, limited or restricted Self Care Discharge Status [phone]): At least 1 percent but less than 20 percent impaired, limited or restricted  Harolyn RutherfordJones, Amyla Heffner B 02/07/2016, 8:54 AM  Mateo FlowJones, Brynn   OTR/L Pager: [phone] Office: [phone] .

## 2016-02-07 NOTE — Evaluation (Signed)
Physical Therapy Evaluation Patient Details Name: Johnny Spence MRN: [id number] DOB: 18-Oct-1950 Today's Date: 02/07/2016   History of Present Illness  65 yo male s/p ACDF C4-6   Clinical Impression  Patient presents with post surgical deficits s/p above. Tolerated gait training with and without RW. Pt staggering at times esp with turns without UE support but no overt LOB. Balance much improved with use of RW. Pt reluctant to use AD but explained importance and concern for safety so pt finally agrees. Education re: precautions, positioning, activity progression etc. Will follow acutely to maximize independence and mobility prior to return home.    Follow Up Recommendations Outpatient PT;Supervision - Intermittent    Equipment Recommendations  Rolling walker with 5" wheels    Recommendations for Other Services       Precautions / Restrictions Precautions Precautions: Cervical Precaution Comments: handout provided and reviewed in detail Restrictions Weight Bearing Restrictions: No      Mobility  Bed Mobility Overal bed mobility: Modified Independent             General bed mobility comments: Cues for log roll technique.  Transfers Overall transfer level: Needs assistance   Transfers: Sit to/from Stand Sit to Stand: Min guard         General transfer comment: Min guard for safety. Slow to stand.   Ambulation/Gait Ambulation/Gait assistance: Min guard Ambulation Distance (Feet): 200 Feet Assistive device: Rolling walker (2 wheeled);None Gait Pattern/deviations: Step-through pattern;Decreased stride length Gait velocity: decreased   General Gait Details: Slow, mostly steady gait with use of RW with cues for safety. Without RW, pt staggering at times with turns.  Myelopathic gait with some difficulty progressing BLEs- decreased clearance noted at times.   Stairs            Wheelchair Mobility    Modified Rankin (Stroke Patients Only)       Balance  Overall balance assessment: Needs assistance Sitting-balance support: Feet supported;No upper extremity supported Sitting balance-Leahy Scale: Good     Standing balance support: During functional activity Standing balance-Leahy Scale: Fair Standing balance comment: Able to stand without UE support but imbalance noted during dynamic activities; balance improved with UE support                             Pertinent Vitals/Pain Pain Assessment: No/denies pain    Home Living Family/patient expects to be discharged to:: Private residence Living Arrangements: Spouse/significant other Available Help at Discharge: Family;Available 24 hours/day Type of Home: Apartment Home Access: Ramped entrance     Home Layout: One level Home Equipment: None Additional Comments: wife can (A) but uses a rollator for ambulation. She can (A) with meals and dressing but can not provide any balance (A) with ambulation    Prior Function Level of Independence: Independent         Comments: Drives, cooks, cleans.     Hand Dominance   Dominant Hand: Right    Extremity/Trunk Assessment   Upper Extremity Assessment: Defer to OT evaluation           Lower Extremity Assessment: Generalized weakness (sensation WFL.)      Cervical / Trunk Assessment: Other exceptions (s/p surgery)  Communication   Communication: No difficulties  Cognition Arousal/Alertness: Awake/alert Behavior During Therapy: WFL for tasks assessed/performed Overall Cognitive Status: Within Functional Limits for tasks assessed  General Comments General comments (skin integrity, edema, etc.): Spouse present during session.    Exercises     Assessment/Plan    PT Assessment Patient needs continued PT services  PT Problem List Decreased strength;Decreased mobility;Decreased activity tolerance;Decreased balance;Pain          PT Treatment Interventions DME instruction;Therapeutic  activities;Gait training;Therapeutic exercise;Patient/family education;Balance training;Functional mobility training    PT Goals (Current goals can be found in the Care Plan section)  Acute Rehab PT Goals Patient Stated Goal: to go home today  PT Goal Formulation: With patient Time For Goal Achievement: 02/21/16 Potential to Achieve Goals: Good    Frequency Min 5X/week   Barriers to discharge        Co-evaluation               End of Session Equipment Utilized During Treatment: Gait belt Activity Tolerance: Patient tolerated treatment well Patient left: in bed;with call bell/phone within reach;with family/visitor present Nurse Communication: Mobility status    Functional Assessment Tool Used: clinical judgment Functional Limitation: Mobility: Walking and moving around Mobility: Walking and Moving Around Current Status [phone]([phone]): At least 1 percent but less than 20 percent impaired, limited or restricted Mobility: Walking and Moving Around Goal Status [phone]): At least 1 percent but less than 20 percent impaired, limited or restricted    Time: 0812-0827 PT Time Calculation (min) (ACUTE ONLY): 15 min   Charges:   PT Evaluation $PT Eval Moderate Complexity: 1 Procedure     PT G Codes:   PT G-Codes **NOT FOR INPATIENT CLASS** Functional Assessment Tool Used: clinical judgment Functional Limitation: Mobility: Walking and moving around Mobility: Walking and Moving Around Current Status ([phone](G8978): At least 1 percent but less than 20 percent impaired, limited or restricted Mobility: Walking and Moving Around Goal Status [phone]([phone]): At least 1 percent but less than 20 percent impaired, limited or restricted    Jesua Tamblyn A Kynadee Dam 02/07/2016, 9:06 AM Mylo RedShauna Ellise Kovack, PT, DPT [phone]

## 2016-02-07 NOTE — Progress Notes (Signed)
    Subjective: Procedure(s) (LRB): ANTERIOR CERVICAL DECOMPRESSION/DISCECTOMY FUSION C4 - C6  2 LEVEL/HARDWARE REMOVAL (N/A) 1 Day Post-Op  Patient reports pain as 2 on 0-10 scale.  Reports decreased arm pain reports incisional neck pain   Positive void Negative bowel movement Positive flatus Negative chest pain or shortness of breath  Objective: Vital signs in last 24 hours: Temp:  [97.6 F (36.4 C)-98.9 F (37.2 C)] 98.2 F (36.8 C) (10/27 0348) Pulse Rate:  [79-101] 88 (10/27 0348) Resp:  [14-24] 18 (10/27 0348) BP: (124-150)/(76-93) 143/85 (10/27 0348) SpO2:  [92 %-97 %] 93 % (10/27 0348)  Intake/Output from previous day: 10/26 0701 - 10/27 0700 In: 2540 [P.O.:240; I.V.:2300] Out: 950 [Urine:900; Blood:50]  Labs: No results for input(s): WBC, RBC, HCT, PLT in the last 72 hours. No results for input(s): NA, K, CL, CO2, BUN, CREATININE, GLUCOSE, CALCIUM in the last 72 hours. No results for input(s): LABPT, INR in the last 72 hours.  Physical Exam: ABD soft Intact pulses distally Incision: dressing C/D/I Compartment soft 5/5 UE/LE  strength.  Sensation to LT intact (improvement) Ambulating - gait disturbance persists but has improved from pre-op   Assessment/Plan: Patient stable  xrays satisfactory Mobilization with physical therapy Encourage incentive spirometry Continue care  Advance diet Up with therapy  Pleased with overall improvement He is A+O X3 ambulating Will need walker for stability as he improves from myelopathic gait disturbance  Venita Lickahari Haskel Dewalt, MD [facility] [phone]

## 2016-03-02 NOTE — Discharge Summary (Signed)
Patient ID: Johnny Spence MRN: [id number] DOB/AGE: [birth year] 65 y.o.  Admit date: 02/06/2016 Discharge date: 03/02/2016  Admission Diagnoses:  Active Problems:   Myelopathy [facility](HCC)   Discharge Diagnoses:  Active Problems:   Myelopathy (HCC)  status post Procedure(s): ANTERIOR CERVICAL DECOMPRESSION/DISCECTOMY FUSION C4 - C6  2 LEVEL/HARDWARE REMOVAL  Past Medical History:  Diagnosis Date  . Anxiety   . Asthma    Symbicort daily and Albuterol as needed  . Chronic neck pain    DDD  . Emphysema lung (HCC)    "mild"  . GERD (gastroesophageal reflux disease)    takes Omeprazole daily  . History of colon polyps    benign  . Hypertension    takes Amlodipine daily  . Nightmares   . Pneumonia 2016  . Sleep apnea   . Weakness    numbness and tingling on right hand    Surgeries: Procedure(s): ANTERIOR CERVICAL DECOMPRESSION/DISCECTOMY FUSION C4 - C6  2 LEVEL/HARDWARE REMOVAL on 02/06/2016   Consultants:   Discharged Condition: Improved  Hospital Course: Johnny Spence is an 65 y.o. male who was admitted 02/06/2016 for operative treatment of <principal problem not specified>. Patient failed conservative treatments (please see the history and physical for the specifics) and had severe unremitting pain that affects sleep, daily activities and work/hobbies. After pre-op clearance, the patient was taken to the operating room on 02/06/2016 and underwent  Procedure(s): ANTERIOR CERVICAL DECOMPRESSION/DISCECTOMY FUSION C4 - C6  2 LEVEL/HARDWARE REMOVAL.  Post op day 1 the pt reports decreased arm pain.  Reports low level of incisional pain controlled on oral medications.  Pt is voiding w/o difficulty.  Pt is ambulating in the hallway. Pt advised by PT to utilize walker when he first goes home.   Patient was given perioperative antibiotics:  Anti-infectives    Start     Dose/Rate Route Frequency Ordered Stop   02/06/16 2000  ceFAZolin (ANCEF) IVPB 2g/100 mL premix     2 g 200  mL/hr over 30 Minutes Intravenous Every 8 hours 02/06/16 1434 02/07/16 0410   02/06/16 0628  ceFAZolin (ANCEF) IVPB 2g/100 mL premix     2 g 200 mL/hr over 30 Minutes Intravenous 30 min pre-op 02/06/16 [id number] 02/06/16 1133       Patient was given sequential compression devices and early ambulation to prevent DVT.   Patient benefited maximally from hospital stay and there were no complications. At the time of discharge, the patient was urinating/moving their bowels without difficulty, tolerating a regular diet, pain is controlled with oral pain medications and they have been cleared by PT/OT.   Recent vital signs: No data found.    Recent laboratory studies: No results for input(s): WBC, HGB, HCT, PLT, NA, K, CL, CO2, BUN, CREATININE, GLUCOSE, INR, CALCIUM in the last 72 hours.  Invalid input(s): PT, 2   Discharge Medications:     Medication List    STOP taking these medications   NON FORMULARY     TAKE these medications   albuterol 108 (90 Base) MCG/ACT inhaler Commonly known as:  PROVENTIL HFA;VENTOLIN HFA Inhale 1-2 puffs into the lungs every 6 (six) hours as needed for wheezing or shortness of breath.   amLODipine 10 MG tablet Commonly known as:  NORVASC Take 10 mg by mouth every evening.   budesonide-formoterol 80-4.5 MCG/ACT inhaler Commonly known as:  SYMBICORT Inhale 2 puffs into the lungs 2 (two) times daily.   CIALIS 5 MG tablet Generic drug:  tadalafil Take 5 mg by  mouth every other day.   methocarbamol 500 MG tablet Commonly known as:  ROBAXIN Take 1 tablet (500 mg total) by mouth 3 (three) times daily as needed for muscle spasms.   multivitamin with minerals Tabs tablet Take 1 tablet by mouth daily at 2 PM.   omeprazole 40 MG capsule Commonly known as:  PRILOSEC Take 40 mg by mouth daily as needed (for gerd/heartburn.).   ondansetron 4 MG tablet Commonly known as:  ZOFRAN Take 1 tablet (4 mg total) by mouth every 8 (eight) hours as needed for nausea  or vomiting.   oxyCODONE-acetaminophen 10-325 MG tablet Commonly known as:  PERCOCET Take 1 tablet by mouth every 4 (four) hours as needed for pain.       Diagnostic Studies: Dg Cervical Spine 2 Or 3 Views  Result Date: 02/06/2016 CLINICAL DATA:  Status post spinal fusion. EXAM: CERVICAL SPINE - 2-3 VIEW COMPARISON:  02/06/2016 FINDINGS: C4-C6 ACDF as described on yesterday's intraoperative fluoroscopic images with anterior plate and screws and interbody spacers in place. There is mild straightening of the cervical spine without evidence of significant listhesis, however T1 is not visualized and C7-T1 alignment cannot be assessed. Mild postoperative prevertebral swelling and soft tissue gas. There is mild disc space narrowing and degenerative spurring at C3-4. There is also mild disc space narrowing at C6-7. Lung volumes are diminished with bronchovascular crowding, incompletely assessed. IMPRESSION: C4-C6 ACDF without evidence of acute complication. Electronically Signed   By: Sebastian AcheAllen  Grady M.D.   On: 02/06/2016 15:17   Dg Cervical Spine 2-3 Views  Result Date: 02/06/2016 CLINICAL DATA:  Post cervical discectomy and fusion EXAM: DG C-ARM 61-120 MIN; CERVICAL SPINE - 2-3 VIEW COMPARISON:  None. FINDINGS: Two views of the lumbar spine submitted. There is anterior fusion with metallic plate and screws at C4, C5 and C6 level. Postsurgical disc spacer material noted at C4-C5 and C5-C6 level. There is anatomic alignment. IMPRESSION: Anterior fusion with metallic plate and screws at C4, C5 and C6 level. There is anatomic alignment. Fluoroscopy time 42 seconds.  Please see the operative report. Electronically Signed   By: Natasha MeadLiviu  Pop M.D.   On: 02/06/2016 12:33   Dg C-arm 61-120 Min  Result Date: 02/06/2016 CLINICAL DATA:  Post cervical discectomy and fusion EXAM: DG C-ARM 61-120 MIN; CERVICAL SPINE - 2-3 VIEW COMPARISON:  None. FINDINGS: Two views of the lumbar spine submitted. There is anterior fusion  with metallic plate and screws at C4, C5 and C6 level. Postsurgical disc spacer material noted at C4-C5 and C5-C6 level. There is anatomic alignment. IMPRESSION: Anterior fusion with metallic plate and screws at C4, C5 and C6 level. There is anatomic alignment. Fluoroscopy time 42 seconds.  Please see the operative report. Electronically Signed   By: Natasha MeadLiviu  Pop M.D.   On: 02/06/2016 12:33      Follow-up Information    Alvy BealBROOKS,DAHARI D, MD. Schedule an appointment as soon as possible for a visit in 2 weeks.   Specialty:  Orthopedic Surgery Why:  For suture removal, For wound re-check, If symptoms worsen Contact information: [address] [place] [address] [phone]           Discharge Plan:  discharge to home Pt will present to clinic in 2 weeks Pt given post op medications  Disposition:     Signed: Kirt BoysMayo, Carmen Christina for Dr. Venita Lickahari [name] [facility] [phone] 03/02/2016, 1:27 PM

## 2018-05-12 IMAGING — RF DG CERVICAL SPINE 2 OR 3 VIEWS
1 series · 2 of 2 positions shown · non-contrast
Comparison: None.

CLINICAL DATA: Post cervical discectomy and fusion

EXAM:
DG C-ARM 61-120 MIN; CERVICAL SPINE - 2-3 VIEW

[Series 1: run · 2 of 2 slices shown]
[im 1/2]
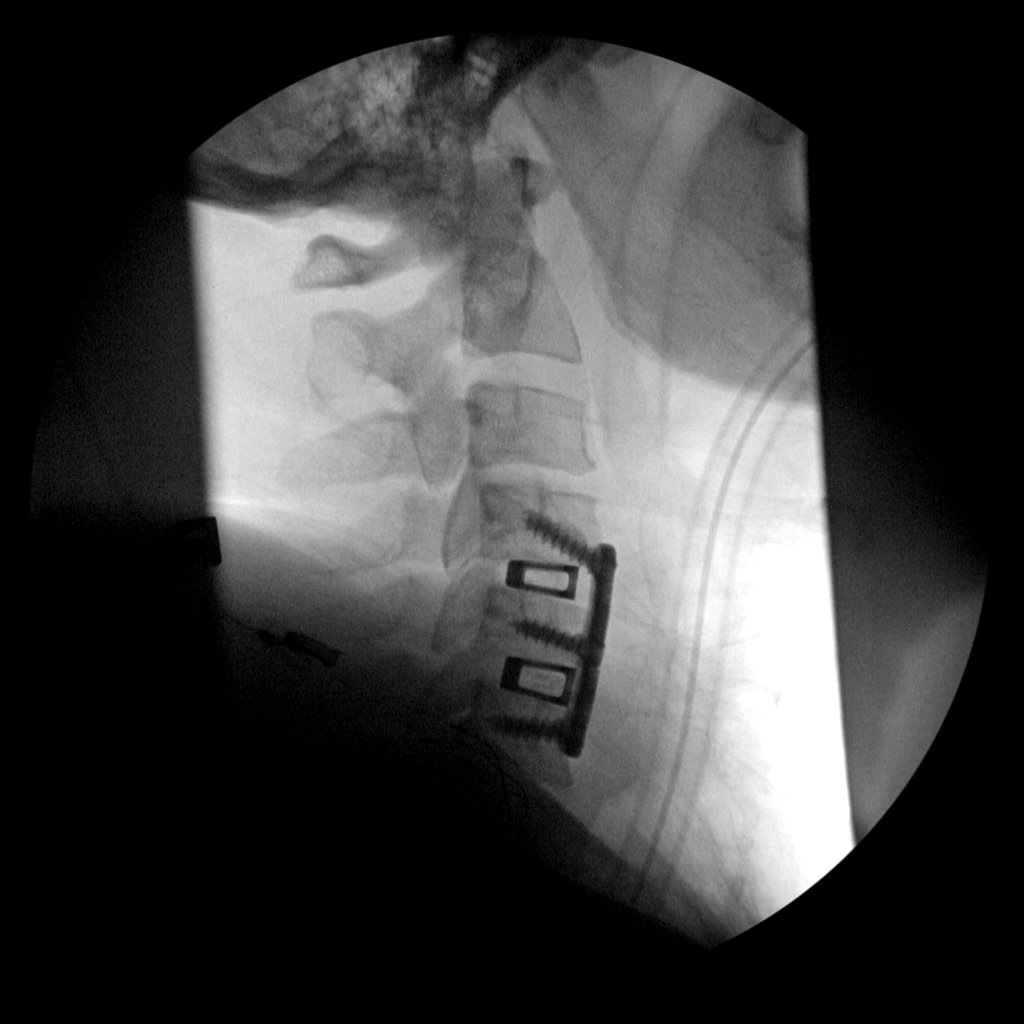
[im 2/2]
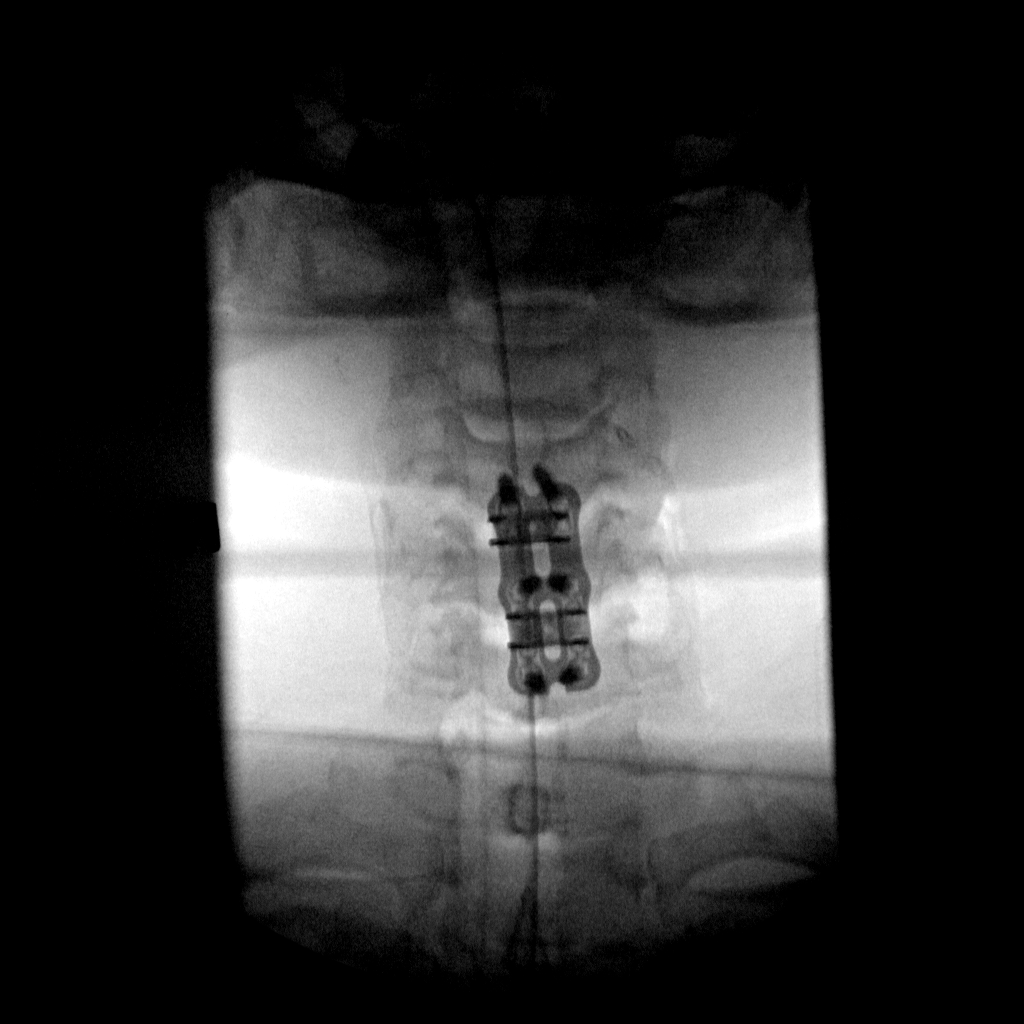

[2 of 2 positions shown; findings below may reference images not displayed]

FINDINGS: Two views of the lumbar spine submitted. There is anterior fusion
with metallic plate and screws at C4, C5 and C6 level. Postsurgical
disc spacer material noted at C4-C5 and C5-C6 level. There is
anatomic alignment.
IMPRESSION: Anterior fusion with metallic plate and screws at C4, C5 and C6
level. There is anatomic alignment.

Fluoroscopy time 42 seconds.  Please see the operative report.

## 2019-04-28 ENCOUNTER — Ambulatory Visit: Payer: Non-veteran care | Admitting: Family Medicine

## 2019-05-12 ENCOUNTER — Ambulatory Visit: Payer: Non-veteran care | Admitting: Family Medicine
